# Patient Record
Sex: Female | Born: 2000 | Race: White | Hispanic: No | Smoking: Current every day smoker
Health system: Southern US, Community
[De-identification: ages and names within clinical notes are randomized; demographics above are authoritative.]

## PROBLEM LIST (undated history)

## (undated) DIAGNOSIS — K3184 Gastroparesis: Secondary | ICD-10-CM

## (undated) DIAGNOSIS — T8859XA Other complications of anesthesia, initial encounter: Secondary | ICD-10-CM

## (undated) DIAGNOSIS — F909 Attention-deficit hyperactivity disorder, unspecified type: Secondary | ICD-10-CM

## (undated) DIAGNOSIS — G90A Postural orthostatic tachycardia syndrome (POTS): Secondary | ICD-10-CM

## (undated) DIAGNOSIS — F329 Major depressive disorder, single episode, unspecified: Secondary | ICD-10-CM

## (undated) DIAGNOSIS — F419 Anxiety disorder, unspecified: Secondary | ICD-10-CM

## (undated) DIAGNOSIS — F609 Personality disorder, unspecified: Secondary | ICD-10-CM

## (undated) DIAGNOSIS — R569 Unspecified convulsions: Secondary | ICD-10-CM

## (undated) DIAGNOSIS — R000 Tachycardia, unspecified: Secondary | ICD-10-CM

## (undated) DIAGNOSIS — Z8489 Family history of other specified conditions: Secondary | ICD-10-CM

## (undated) DIAGNOSIS — N323 Diverticulum of bladder: Secondary | ICD-10-CM

## (undated) DIAGNOSIS — J189 Pneumonia, unspecified organism: Secondary | ICD-10-CM

## (undated) DIAGNOSIS — K219 Gastro-esophageal reflux disease without esophagitis: Secondary | ICD-10-CM

## (undated) DIAGNOSIS — D649 Anemia, unspecified: Secondary | ICD-10-CM

## (undated) HISTORY — PX: APPENDECTOMY: SHX54

## (undated) HISTORY — PX: HIP ARTHROPLASTY: SHX981

## (undated) HISTORY — PX: OTHER SURGICAL HISTORY: SHX169

---
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
## 2022-05-23 ENCOUNTER — Other Ambulatory Visit: Payer: Self-pay | Admitting: Neurosurgery

## 2022-06-06 ENCOUNTER — Other Ambulatory Visit: Payer: Self-pay | Admitting: Neurosurgery

## 2022-06-09 ENCOUNTER — Other Ambulatory Visit: Payer: Self-pay

## 2022-06-09 ENCOUNTER — Encounter (HOSPITAL_COMMUNITY): Payer: Self-pay | Admitting: Neurosurgery

## 2022-06-09 NOTE — Progress Notes (Signed)
Anesthesia Chart Review: SAME DAY WORK-UP  Case: [id number] Date/Time: 06/10/22 0923   Procedure: VAGAL NERVE STIMULATOR IMPLANT - 3C   Anesthesia type: General   Pre-op diagnosis: PARTIAL SYMPTOMATIC EPILEPSY WITH COMPLEX PARTIAL SEIZURES,INTRACTABLE, WITHOUT STATUS EPILEPTICUS   Location: [facility] OR ROOM 18 / [facility] OR   Surgeons: Lisbeth Renshaw, MD       DISCUSSION: Patient is a 21 year old female scheduled for the above procedure.  History obtained by PAT RN interview and [facility] chart review. History includes never smoker, POTS, post-COVID syndrome, epilepsy, ADHD, anxiety. GERD, anorexia nervosa, appendectomy, left hip arthroscopy (07/04/18).  Reported that she "freaks out" with anesthesia.  - GI consult 05/19/22 by Duane Boston, PA-C. Patient with "a long history of waxing and waning anorexia nervosa presenting with worsening nausea, vomiting of undigested food after several days, and postprandial abdominal pain. She has previously been treated by her PCP with Prevacid, Megace, Marinol, and Creon. Prior evaluation when she was 15, 6 years ago with EGD and colonoscopy were normal. Colonoscopy more recently in 2019 was normal again." Gastroparesis suspected, but plan to start with EGD. Changed to Nexium.  - Last cardiology visit 04/21/22 with Shaune Pascal, NP for inappropriate sinus tachycardia, POTS, post COVID syndrome.  No definitive arrhythmias on January 2023 Holter monitor. Patient had a consultation with electrophysiology Chales Abrahams, Archie, MD) as treatment with beta-blocker caused symptomatic hypotension. A tilt table test was negative.  She stopped taking midodrine because it was causing high BP readings, and she did not believe it was helping.  She had not worn compression stockings nor elevated her extremities.  She did try to increase her hydration.  Reported plan for evaluation for Ehlers-Danlos syndrome..  Continue conservative measures for POTS.  She was provided information  regarding [facility] POTS clinic.  3 to 31-month follow-up planned. Most recent cardiac testing outlined below in CV section.   She is a same-day work-up, so anesthesia team to evaluate on the day of surgery.   PROVIDERS: Wall, Nadara Mustard, FNP is PCP  Isabelle Course, PA-C is rheumatology provider Lenis Noon, DO is cardiologist   LABS: For day of surgery as indicated.   IMAGES: MRI Head 02/23/21 ([facility]): IMPRESSION:  1.  No acute intracranial abnormality.  2.  Small ventricles, which can be normal for age. No other evidence to suggest IIH.  3.  Chronic bilateral maxillary sinus disease. Possible prior intranasal sinus surgery, correlate with history.     EKG: EKG 10/17/21 ([facility]): Per result narrative in Care Everywhere: "Diagnosis Normal sinus rhythm  Possible Left atrial enlargement  T wave abnormality, consider inferior ischemia  Abnormal ECG  When compared with ECG of 16-Oct-2021 21:47,  No significant change was found  Wilmot, Kobina ([id number]) on 10/19/2021 9:37:25 PM certifies that he/she has reviewed the ECG tracing and confirms the  independent interpretation is  correct."   CV: Holter Monitor 12/03/21 ([facility]): Impression: Normal ambulatory ECG monitor with predominant sinus rhythm.  Sinus tachycardia was noted 26% of the time with no malignant tachycardic rhythms noted. Symptoms were reported but not correlated with any malignant arrhythmias or significant ectopy.  Clinical correlation is recommended.   Echo 11/05/21 ([facility]): Normal left ventricular chamber size, wall thickness, and systolic wall  motion.    Preserved LVEF 50-55%.  Manual apical biplane EF 50-55%. Impaired GLS  -16.1%.    Grade 1 diastolic dysfunction.    No significant valvular abnormalities are noted.    Pulmonary artery systolic pressure cannot be determined.  Compared to prior echocardiogram report dated 12/29/2020: There are no  significant changes noted.   Tilt Table Test 02/11/21  ([facility]): This result has an attachment that is not available.  Normal baseline BP and heart rate  Essentially normal BP and heart rate response during HUTT maneuver  Patient reported "dizziness and blurred vision " despite unchanged BP and  heart rate response  No evidence of abnormal neurocardiogenic response during HUTT maneuver   30 Day Event Monitor 12/29/20-01/27/21 ([facility]): Impression: Normal ambulatory ECG monitor with predominant sinus rhythm. No symptoms were reported. Symptoms were reported but not correlated with any malignant arrhythmias or significant ectopy.  There was a reported sustained episode of SVT but upon further review, appears to be sinus tachycardia as it is still within the normal physiologic range expected for her age.   Past Medical History:  Diagnosis Date   ADHD (attention deficit hyperactivity disorder)    Anemia    Anxiety    Complication of anesthesia    freaks out   Family history of adverse reaction to anesthesia    PGF - n/v   GERD (gastroesophageal reflux disease)    Pneumonia    "WALKING" x 2   Seizures (HCC)     Past Surgical History:  Procedure Laterality Date   APPENDECTOMY     cyst from O[place]     HIP ARTHROPLASTY Left     MEDICATIONS: No current facility-administered medications for this encounter.    albuterol (VENTOLIN HFA) 108 (90 Base) MCG/ACT inhaler   clonazePAM (KLONOPIN) 1 MG tablet   EPIPEN 2-PAK 0.3 MG/0.3ML SOAJ injection   esomeprazole (NEXIUM) 40 MG capsule   hydrocortisone cream 1 %   ondansetron (ZOFRAN) 8 MG tablet   promethazine (PHENERGAN) 25 MG tablet   VYVANSE 60 MG capsule    Shonna Chock, PA-C Surgical Short Stay/Anesthesiology [facility] Phone [phone] [facility] Phone [phone] 06/09/2022 5:06 PM

## 2022-06-09 NOTE — Progress Notes (Addendum)
Rebecca Hale denies chest pain or shortness of breath. Patient denies having any s/s of Covid in her household.  Patient denies any known exposure to Covid.  Rebecca Hale has a Psychiatrist cardiologist and PCP, she reported to me that she is trying to change Drs.  Rebecca Hale states that she has a guardian that will need to allowed to be with her is she "freaks out"  when she wakes up. Patient reports that she will bring papers.  Rebecca Hale reports that she is drinking a lot of fluids to stay hydrated.  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

## 2022-06-10 ENCOUNTER — Encounter (HOSPITAL_COMMUNITY)
Admission: RE | Disposition: A | Discharge status: Home / Self Care | Payer: Self-pay | Source: Home / Self Care | Attending: Neurosurgery

## 2022-06-10 ENCOUNTER — Ambulatory Visit (HOSPITAL_BASED_OUTPATIENT_CLINIC_OR_DEPARTMENT_OTHER): Payer: Medicaid Other | Admitting: Vascular Surgery

## 2022-06-10 ENCOUNTER — Other Ambulatory Visit: Payer: Self-pay

## 2022-06-10 ENCOUNTER — Ambulatory Visit (HOSPITAL_COMMUNITY): Payer: Medicaid Other | Admitting: Vascular Surgery

## 2022-06-10 ENCOUNTER — Encounter (HOSPITAL_COMMUNITY): Payer: Self-pay | Admitting: Neurosurgery

## 2022-06-10 ENCOUNTER — Ambulatory Visit (HOSPITAL_COMMUNITY)
Admission: RE | Admit: 2022-06-10 | Discharge: 2022-06-10 | Disposition: A | Discharge status: Home / Self Care | Payer: Medicaid Other | Attending: Neurosurgery | Admitting: Neurosurgery

## 2022-06-10 DIAGNOSIS — G90A Postural orthostatic tachycardia syndrome (POTS): Secondary | ICD-10-CM | POA: Diagnosis not present

## 2022-06-10 DIAGNOSIS — G40919 Epilepsy, unspecified, intractable, without status epilepticus: Secondary | ICD-10-CM | POA: Insufficient documentation

## 2022-06-10 DIAGNOSIS — F909 Attention-deficit hyperactivity disorder, unspecified type: Secondary | ICD-10-CM

## 2022-06-10 DIAGNOSIS — F418 Other specified anxiety disorders: Secondary | ICD-10-CM | POA: Diagnosis not present

## 2022-06-10 HISTORY — DX: Other complications of anesthesia, initial encounter: T88.59XA

## 2022-06-10 HISTORY — DX: Major depressive disorder, single episode, unspecified: F32.9

## 2022-06-10 HISTORY — DX: Diverticulum of bladder: N32.3

## 2022-06-10 HISTORY — DX: Personality disorder, unspecified: F60.9

## 2022-06-10 HISTORY — DX: Anxiety disorder, unspecified: F41.9

## 2022-06-10 HISTORY — DX: Tachycardia, unspecified: R00.0

## 2022-06-10 HISTORY — DX: Gastroparesis: K31.84

## 2022-06-10 HISTORY — DX: Pneumonia, unspecified organism: J18.9

## 2022-06-10 HISTORY — PX: VAGUS NERVE STIMULATOR INSERTION: SHX348

## 2022-06-10 HISTORY — DX: Gastro-esophageal reflux disease without esophagitis: K21.9

## 2022-06-10 HISTORY — DX: Attention-deficit hyperactivity disorder, unspecified type: F90.9

## 2022-06-10 HISTORY — DX: Family history of other specified conditions: Z84.89

## 2022-06-10 HISTORY — DX: Postural orthostatic tachycardia syndrome (POTS): G90.A

## 2022-06-10 HISTORY — DX: Unspecified convulsions: R56.9

## 2022-06-10 HISTORY — DX: Anemia, unspecified: D64.9

## 2022-06-10 LAB — CBC
HCT: 44 % (ref 36.0–46.0)
Hemoglobin: 15 g/dL (ref 12.0–15.0)
MCH: 30.7 pg (ref 26.0–34.0)
MCHC: 34.1 g/dL (ref 30.0–36.0)
MCV: 90.2 fL (ref 80.0–100.0)
Platelets: 291 10*3/uL (ref 150–400)
RBC: 4.88 MIL/uL (ref 3.87–5.11)
RDW: 13.5 % (ref 11.5–15.5)
WBC: 6.2 10*3/uL (ref 4.0–10.5)
nRBC: 0 % (ref 0.0–0.2)

## 2022-06-10 LAB — POCT PREGNANCY, URINE: Preg Test, Ur: NEGATIVE

## 2022-06-10 LAB — SURGICAL PCR SCREEN
MRSA, PCR: NEGATIVE
Staphylococcus aureus: NEGATIVE

## 2022-06-10 SURGERY — VAGAL NERVE STIMULATOR IMPLANT
Anesthesia: General

## 2022-06-10 MED ORDER — EPHEDRINE SULFATE-NACL 50-0.9 MG/10ML-% IV SOSY
PREFILLED_SYRINGE | INTRAVENOUS | Status: DC | PRN
  Administered 2022-06-10: 10 mg via INTRAVENOUS

## 2022-06-10 MED ORDER — ORAL CARE MOUTH RINSE
15.0000 mL | Freq: Once | OROMUCOSAL | Status: AC
Start: 2022-06-10 — End: 2022-06-10

## 2022-06-10 MED ORDER — BUPIVACAINE HCL (PF) 0.5 % IJ SOLN
INTRAMUSCULAR | Status: AC
  Filled 2022-06-10: qty 30

## 2022-06-10 MED ORDER — ONDANSETRON HCL 4 MG/2ML IJ SOLN: 4.0000 mg | Freq: Once | INTRAMUSCULAR | Status: DC | PRN

## 2022-06-10 MED ORDER — CHLORHEXIDINE GLUCONATE CLOTH 2 % EX PADS: 6.0000 | MEDICATED_PAD | Freq: Once | CUTANEOUS | Status: DC

## 2022-06-10 MED ORDER — FENTANYL CITRATE (PF) 100 MCG/2ML IJ SOLN: 25.0000 ug | INTRAMUSCULAR | Status: DC | PRN

## 2022-06-10 MED ORDER — ROCURONIUM BROMIDE 10 MG/ML (PF) SYRINGE
PREFILLED_SYRINGE | INTRAVENOUS | Status: AC
  Filled 2022-06-10: qty 10

## 2022-06-10 MED ORDER — FENTANYL CITRATE (PF) 250 MCG/5ML IJ SOLN
INTRAMUSCULAR | Status: DC | PRN
  Administered 2022-06-10 (×2): 50 ug via INTRAVENOUS

## 2022-06-10 MED ORDER — FENTANYL CITRATE (PF) 250 MCG/5ML IJ SOLN
INTRAMUSCULAR | Status: AC
  Filled 2022-06-10: qty 5

## 2022-06-10 MED ORDER — OXYCODONE-ACETAMINOPHEN 10-325 MG PO TABS: 1.0000 | ORAL_TABLET | Freq: Three times a day (TID) | ORAL | 0 refills | Status: AC | PRN

## 2022-06-10 MED ORDER — ROCURONIUM BROMIDE 10 MG/ML (PF) SYRINGE
PREFILLED_SYRINGE | INTRAVENOUS | Status: DC | PRN
  Administered 2022-06-10: 50 mg via INTRAVENOUS

## 2022-06-10 MED ORDER — SUGAMMADEX SODIUM 200 MG/2ML IV SOLN
INTRAVENOUS | Status: DC | PRN
  Administered 2022-06-10: 200 mg via INTRAVENOUS

## 2022-06-10 MED ORDER — LIDOCAINE 2% (20 MG/ML) 5 ML SYRINGE
INTRAMUSCULAR | Status: AC
  Filled 2022-06-10: qty 5

## 2022-06-10 MED ORDER — MIDAZOLAM HCL 2 MG/2ML IJ SOLN
INTRAMUSCULAR | Status: DC | PRN
  Administered 2022-06-10: 2 mg via INTRAVENOUS

## 2022-06-10 MED ORDER — MIDAZOLAM HCL 2 MG/2ML IJ SOLN
INTRAMUSCULAR | Status: AC
  Filled 2022-06-10: qty 2

## 2022-06-10 MED ORDER — LIDOCAINE-EPINEPHRINE 1 %-1:100000 IJ SOLN
INTRAMUSCULAR | Status: DC | PRN
  Administered 2022-06-10: 5 mL
  Administered 2022-06-10: 4 mL

## 2022-06-10 MED ORDER — 0.9 % SODIUM CHLORIDE (POUR BTL) OPTIME
TOPICAL | Status: DC | PRN
  Administered 2022-06-10: 1000 mL

## 2022-06-10 MED ORDER — CHLORHEXIDINE GLUCONATE 0.12 % MT SOLN: 15.0000 mL | Freq: Once | OROMUCOSAL | Status: AC

## 2022-06-10 MED ORDER — PROPOFOL 10 MG/ML IV BOLUS
INTRAVENOUS | Status: AC
  Filled 2022-06-10: qty 20

## 2022-06-10 MED ORDER — LACTATED RINGERS IV SOLN: INTRAVENOUS | Status: DC

## 2022-06-10 MED ORDER — GLYCOPYRROLATE 0.2 MG/ML IJ SOLN
INTRAMUSCULAR | Status: DC | PRN
  Administered 2022-06-10: .2 mg via INTRAVENOUS

## 2022-06-10 MED ORDER — LIDOCAINE-EPINEPHRINE 1 %-1:100000 IJ SOLN
INTRAMUSCULAR | Status: AC
  Filled 2022-06-10: qty 1

## 2022-06-10 MED ORDER — ACETAMINOPHEN 500 MG PO TABS
1000.0000 mg | ORAL_TABLET | Freq: Once | ORAL | Status: AC
  Administered 2022-06-10: 1000 mg via ORAL
  Filled 2022-06-10: qty 2

## 2022-06-10 MED ORDER — CEFAZOLIN SODIUM-DEXTROSE 2-4 GM/100ML-% IV SOLN
2.0000 g | INTRAVENOUS | Status: AC
  Administered 2022-06-10: 2 g via INTRAVENOUS
  Filled 2022-06-10: qty 100

## 2022-06-10 MED ORDER — DEXAMETHASONE SODIUM PHOSPHATE 10 MG/ML IJ SOLN
INTRAMUSCULAR | Status: DC | PRN
  Administered 2022-06-10: 10 mg via INTRAVENOUS

## 2022-06-10 MED ORDER — PHENYLEPHRINE 80 MCG/ML (10ML) SYRINGE FOR IV PUSH (FOR BLOOD PRESSURE SUPPORT)
PREFILLED_SYRINGE | INTRAVENOUS | Status: DC | PRN
  Administered 2022-06-10: 120 ug via INTRAVENOUS
  Administered 2022-06-10: 80 ug via INTRAVENOUS

## 2022-06-10 MED ORDER — PHENYLEPHRINE HCL-NACL 20-0.9 MG/250ML-% IV SOLN
INTRAVENOUS | Status: DC | PRN
  Administered 2022-06-10: 50 ug/min via INTRAVENOUS

## 2022-06-10 MED ORDER — THROMBIN 5000 UNITS EX SOLR
OROMUCOSAL | Status: DC | PRN
  Administered 2022-06-10: 5 mL via TOPICAL

## 2022-06-10 MED ORDER — LIDOCAINE 2% (20 MG/ML) 5 ML SYRINGE
INTRAMUSCULAR | Status: DC | PRN
  Administered 2022-06-10: 40 mg via INTRAVENOUS

## 2022-06-10 MED ORDER — BUPIVACAINE HCL 0.5 % IJ SOLN
INTRAMUSCULAR | Status: DC | PRN
  Administered 2022-06-10: 5 mL
  Administered 2022-06-10: 4 mL

## 2022-06-10 MED ORDER — CHLORHEXIDINE GLUCONATE 0.12 % MT SOLN
OROMUCOSAL | Status: AC
  Administered 2022-06-10: 15 mL via OROMUCOSAL
  Filled 2022-06-10: qty 15

## 2022-06-10 MED ORDER — DEXMEDETOMIDINE (PRECEDEX) IN NS 20 MCG/5ML (4 MCG/ML) IV SYRINGE
PREFILLED_SYRINGE | INTRAVENOUS | Status: DC | PRN
  Administered 2022-06-10 (×5): 4 ug via INTRAVENOUS

## 2022-06-10 MED ORDER — PROPOFOL 10 MG/ML IV BOLUS
INTRAVENOUS | Status: DC | PRN
  Administered 2022-06-10: 120 mg via INTRAVENOUS

## 2022-06-10 MED ORDER — ONDANSETRON HCL 4 MG/2ML IJ SOLN
INTRAMUSCULAR | Status: DC | PRN
  Administered 2022-06-10: 4 mg via INTRAVENOUS

## 2022-06-10 MED ORDER — THROMBIN 5000 UNITS EX SOLR
CUTANEOUS | Status: AC
  Filled 2022-06-10: qty 5000

## 2022-06-10 SURGICAL SUPPLY — 60 items
BAG COUNTER SPONGE SURGICOUNT (BAG) ×2 IMPLANT
BAND RUBBER #18 3X1/16 STRL (MISCELLANEOUS) ×2 IMPLANT
BENZOIN TINCTURE PRP APPL 2/3 (GAUZE/BANDAGES/DRESSINGS) IMPLANT
BLADE CLIPPER SURG (BLADE) IMPLANT
BLADE SURG 11 STRL SS (BLADE) ×3 IMPLANT
CANISTER SUCT 3000ML PPV (MISCELLANEOUS) ×2 IMPLANT
CARTRIDGE OIL MAESTRO DRILL (MISCELLANEOUS) IMPLANT
DERMABOND ADVANCED (GAUZE/BANDAGES/DRESSINGS) ×1
DERMABOND ADVANCED .7 DNX12 (GAUZE/BANDAGES/DRESSINGS) ×1 IMPLANT
DIFFUSER DRILL AIR PNEUMATIC (MISCELLANEOUS) IMPLANT
DRAPE CAMERA VIDEO/LASER (DRAPES) ×2 IMPLANT
DRAPE HALF SHEET 40X57 (DRAPES) IMPLANT
DRAPE LAPAROTOMY 100X72 PEDS (DRAPES) ×2 IMPLANT
DRAPE MICROSCOPE LEICA (MISCELLANEOUS) ×1 IMPLANT
DRSG OPSITE POSTOP 3X4 (GAUZE/BANDAGES/DRESSINGS) IMPLANT
DRSG TEGADERM 4X4.75 (GAUZE/BANDAGES/DRESSINGS) ×8 IMPLANT
DURAPREP 6ML APPLICATOR 50/CS (WOUND CARE) ×2 IMPLANT
ELECT COATED BLADE 2.86 ST (ELECTRODE) ×2 IMPLANT
ELECT REM PT RETURN 9FT ADLT (ELECTROSURGICAL) ×2
ELECTRODE REM PT RTRN 9FT ADLT (ELECTROSURGICAL) ×1 IMPLANT
GAUZE 4X4 16PLY ~~LOC~~+RFID DBL (SPONGE) IMPLANT
GENERATOR MODEL 106 ASPIRE (Neuro Prosthesis/Implant) ×1 IMPLANT
GLOVE BIO SURGEON STRL SZ7.5 (GLOVE) ×1 IMPLANT
GLOVE BIOGEL PI IND STRL 7.0 (GLOVE) IMPLANT
GLOVE BIOGEL PI IND STRL 7.5 (GLOVE) ×2 IMPLANT
GLOVE BIOGEL PI INDICATOR 7.0 (GLOVE) ×1
GLOVE BIOGEL PI INDICATOR 7.5 (GLOVE) ×2
GLOVE ECLIPSE 7.0 STRL STRAW (GLOVE) ×1 IMPLANT
GLOVE SURG SS PI 6.5 STRL IVOR (GLOVE) ×3 IMPLANT
GOWN STRL REUS W/ TWL LRG LVL3 (GOWN DISPOSABLE) ×2 IMPLANT
GOWN STRL REUS W/ TWL XL LVL3 (GOWN DISPOSABLE) IMPLANT
GOWN STRL REUS W/TWL 2XL LVL3 (GOWN DISPOSABLE) IMPLANT
GOWN STRL REUS W/TWL LRG LVL3 (GOWN DISPOSABLE) ×3
GOWN STRL REUS W/TWL XL LVL3 (GOWN DISPOSABLE)
HEMOSTAT POWDER KIT SURGIFOAM (HEMOSTASIS) ×2 IMPLANT
KIT BASIN OR (CUSTOM PROCEDURE TRAY) ×2 IMPLANT
KIT TURNOVER KIT B (KITS) ×2 IMPLANT
LEAD PERENNIAFLEX 2-3 304 (Neuro Prosthesis/Implant) ×1 IMPLANT
LOOP VESSEL MAXI BLUE (MISCELLANEOUS) IMPLANT
LOOP VESSEL MINI RED (MISCELLANEOUS) IMPLANT
NEEDLE HYPO 22GX1.5 SAFETY (NEEDLE) ×2 IMPLANT
NS IRRIG 1000ML POUR BTL (IV SOLUTION) ×2 IMPLANT
OIL CARTRIDGE MAESTRO DRILL (MISCELLANEOUS)
PACK LAMINECTOMY NEURO (CUSTOM PROCEDURE TRAY) ×2 IMPLANT
PAD ARMBOARD 7.5X6 YLW CONV (MISCELLANEOUS) ×6 IMPLANT
SPIKE FLUID TRANSFER (MISCELLANEOUS) ×2 IMPLANT
SPONGE INTESTINAL PEANUT (DISPOSABLE) IMPLANT
SPONGE SURGIFOAM ABS GEL SZ50 (HEMOSTASIS) IMPLANT
SUT ETHILON 3 0 FSL (SUTURE) IMPLANT
SUT NURALON 4 0 TR CR/8 (SUTURE) ×2 IMPLANT
SUT VIC AB 0 CT1 27 (SUTURE)
SUT VIC AB 0 CT1 27XBRD ANBCTR (SUTURE) ×1 IMPLANT
SUT VIC AB 0 CT2 27 (SUTURE) ×1 IMPLANT
SUT VIC AB 3-0 SH 18 (SUTURE) IMPLANT
SUT VIC AB 3-0 SH 8-18 (SUTURE) ×3 IMPLANT
SUT VICRYL 3-0 RB1 18 ABS (SUTURE) ×5 IMPLANT
TOWEL GREEN STERILE (TOWEL DISPOSABLE) ×2 IMPLANT
TOWEL GREEN STERILE FF (TOWEL DISPOSABLE) ×2 IMPLANT
TUNNELING TOOL (MISCELLANEOUS) ×1 IMPLANT
WATER STERILE IRR 1000ML POUR (IV SOLUTION) ×2 IMPLANT

## 2022-06-10 NOTE — Anesthesia Preprocedure Evaluation (Addendum)
Anesthesia Evaluation  Patient identified by MRN, date of birth, ID band Patient awake    Reviewed: Allergy & Precautions, NPO status , Patient's Chart, lab work & pertinent test results  History of Anesthesia Complications (+) Family history of anesthesia reaction and history of anesthetic complications  Airway Mallampati: II  TM Distance: >3 FB Neck ROM: Full    Dental  (+) Teeth Intact, Dental Advisory Given   Pulmonary Current Smoker and Patient abstained from smoking.,    Pulmonary exam normal breath sounds clear to auscultation       Cardiovascular Normal cardiovascular exam Rhythm:Regular Rate:Normal  POTS    Neuro/Psych Seizures -, Poorly Controlled,  PSYCHIATRIC DISORDERS Anxiety Depression PARTIAL SYMPTOMATIC EPILEPSY WITH COMPLEX PARTIAL SEIZURES,INTRACTABLE, WITHOUT STATUS EPILEPTICUS    GI/Hepatic Neg liver ROS, GERD  ,Gastroparesis    Endo/Other  negative endocrine ROS  Renal/GU negative Renal ROS     Musculoskeletal negative musculoskeletal ROS (+)   Abdominal   Peds  (+) ADHD Hematology negative hematology ROS (+)   Anesthesia Other Findings Day of surgery medications reviewed with the patient.  Reproductive/Obstetrics negative OB ROS                            Anesthesia Physical Anesthesia Plan  ASA: 3  Anesthesia Plan: General   Post-op Pain Management: Tylenol PO (pre-op)*   Induction: Intravenous  PONV Risk Score and Plan: 2 and Midazolam, Dexamethasone and Ondansetron  Airway Management Planned: Oral ETT  Additional Equipment:   Intra-op Plan:   Post-operative Plan: Extubation in OR  Informed Consent: I have reviewed the patients History and Physical, chart, labs and discussed the procedure including the risks, benefits and alternatives for the proposed anesthesia with the patient or authorized representative who has indicated his/her understanding and  acceptance.     Dental advisory given  Plan Discussed with: CRNA  Anesthesia Plan Comments:         Anesthesia Quick Evaluation

## 2022-06-10 NOTE — Op Note (Signed)
PREOP DIAGNOSIS: Medically intractable epilepsy   POSTOP DIAGNOSIS: Same  PROCEDURE: 1. Placement of left Vagal nerve stimulator  SURGEON: Dr. Lisbeth Renshaw, MD  ASSISTANT: None  ANESTHESIA: General Endotracheal  EBL: Minimal  SPECIMENS: None  DRAINS: None  COMPLICATIONS: None immediate  CONDITION: Hemodynamically stable to PACU  HISTORY: Aryan Bello is a 21 y.o. female who was initially seen in the outpatient clinic as a referral from her neurologist for medically intractable epilepsy.  Surgical treatment options for her medically intractable epilepsy were extensively reviewed.  The patient did want to proceed with placement of a vagal nerve stimulator.  The patient appeared to be a good candidate for the procedure. The risks and benefits of the surgery were reviewed in detail. After all questions were answered, informed consent was obtained.  PROCEDURE IN DETAIL: After informed consent was obtained and witnessed, the patient was brought to the operating room. After induction of general anesthesia, the patient was positioned on the operative table in the supine position. All pressure points were meticulously padded. Skin incisions were then marked out and prepped and draped in the usual sterile fashion.  After timeout was conducted, left sided transverse neck skin incision was infiltrated with local anesthetic with epinephrine. Skin incision was then made sharply, and Bovie electrocautery was used to dissect the subcutaneous tissue. The platysma muscle was identified and incised. The platysma was then undermined. The medial border of the sternocleidomastoid muscle was identified, and dissection was carried out utilizing natural tissue planes of the neck until the carotid sheath was identified. The jugular vein was initially identified and the carotid sheath was incised. The vagus nerve was then identified running between the carotid and jugular. The vagus nerve was  circumferentially dissected for length of a proximally 2-1/2 cm. The infraclavicular incision was then made, and Bovie electrocautery was used to dissect the subcutaneous tissue. A subcutaneous pocket was then made. A subcutaneous tunneler was then passed from the neck to the infraclavicular incision. The vagal stimulator leads were then tunneled. The leads were then placed on the vagus nerve. A relaxing curl was then placed, and the leaves were tacked to the sternocleidomastoid muscle. The generator was then connected to the leads, and testing was done to confirm normal impedance, and good placement and heart rate detection. The generator was then placed in the subcutaneous pocket. Wounds were then irrigated with copious amounts of normal saline irrigation. The platysma was closed using interrupted 3-0 Vicryl stitches. Subcutaneous layer and the subclavicular incision was closed using interrupted 3-0 Vicryl stitches. Skin was then closed using interrupted 3-0 Vicryl, and a layer of Dermabond was applied.   At the end of the case all sponge, needle, cottonoid, and instrument counts were correct. The patient was then extubated, transferred to the stretcher, and taken to the postanesthesia care unit in stable hemodynamic condition.   Lisbeth Renshaw, MD [facility]

## 2022-06-10 NOTE — Discharge Summary (Signed)
  Physician Discharge Summary  Patient ID: Rebecca Hale MRN: [id number] DOB/AGE: 22-16-2002 22 y.o.  Admit date: 06/10/2022 Discharge date: 06/10/2022  Admission Diagnoses:  Medically intractable epilepsy  Discharge Diagnoses:  Same Active Problems:   * No active hospital problems. *   Discharged Condition: Stable  Hospital Course:  Rebecca Hale is a 21 y.o. female who underwent uncomplicated placement of a left VNS. She was at baseline postop and discharged home from PACU in stable condition.  Treatments: Surgery - Placement of left VNS  Discharge Exam: Blood pressure (!) 96/51, pulse (!) 57, temperature (!) 97.5 F (36.4 C), resp. rate 14, height 5\' 6"  (1.676 m), weight 42.2 kg, last menstrual period 05/30/2022, SpO2 99 %. Awake, alert, oriented Speech fluent, appropriate CN grossly intact 5/5 BUE/BLE Wound c/d/i  Disposition:    Allergies as of 06/10/2022       Reactions   Amoxicillin Diarrhea, Nausea And Vomiting, Nausea Only   Other reaction(s): GI Upset (intolerance), Other   Bupropion Nausea And Vomiting   Iodine Rash   Nickel Rash   Oseltamivir    Other reaction(s): Delusions (intolerance), Mental Status Changes (intolerance), Other, Psychosis (intolerance)   Shellfish Allergy Rash   Sulfa Antibiotics Nausea And Vomiting   Novocain [procaine] Other (See Comments)   All cain ineffective   Penicillins Other (See Comments)   projectile vomit   Stadol [butorphanol] Other (See Comments)   temors   Sulfamethoxazole-trimethoprim Nausea And Vomiting   Latex Rash   Tramadol Rash        Medication List     TAKE these medications    albuterol 108 (90 Base) MCG/ACT inhaler Commonly known as: VENTOLIN HFA Inhale 2 puffs into the lungs every 6 (six) hours as needed for shortness of breath.   clonazePAM 1 MG tablet Commonly known as: KLONOPIN Take 1 mg by mouth See admin instructions. Take 1 mg twice a day in the morning and bedtime and an additional 1  mg if needed during the day for seizures   EpiPen 2-Pak 0.3 mg/0.3 mL Soaj injection Generic drug: EPINEPHrine Inject 0.3 mg into the muscle as needed for anaphylaxis.   esomeprazole 40 MG capsule Commonly known as: NEXIUM Take 40 mg by mouth every morning.   hydrocortisone cream 1 % Apply 1 Application topically daily as needed for itching.   ondansetron 8 MG tablet Commonly known as: ZOFRAN Take 8 mg by mouth every 8 (eight) hours as needed for nausea or vomiting.   oxyCODONE-acetaminophen 10-325 MG tablet Commonly known as: Percocet Take 1 tablet by mouth every 8 (eight) hours as needed for up to 3 days for pain.   promethazine 25 MG tablet Commonly known as: PHENERGAN Take 25 mg by mouth every 6 (six) hours as needed for nausea or vomiting.   Vyvanse 60 MG capsule Generic drug: lisdexamfetamine Take 60 mg by mouth daily.        Follow-up Information     06/12/2022, MD Follow up.   Specialty: Neurosurgery Contact information: [address] [place] [phone]                 Signed: [phone] 06/10/2022, 3:23 PM

## 2022-06-10 NOTE — H&P (Signed)
Chief Complaint   Epilepsy  History of Present Illness  Rebecca Hale is a 21 y.o. female with a history of medically intractable epilepsy for several years.  She has undergone multiple EEG tests and been diagnosed with complex partial epilepsy.  She has been attempted on multiple different antiepileptic medications with significant side effects.  She is currently maintained on Trileptal.  After discussion of surgical options, she elected to proceed with vagal nerve stimulator therapy.  Past Medical History   Past Medical History:  Diagnosis Date   ADHD (attention deficit hyperactivity disorder)    Anemia    Anxiety    Complication of anesthesia    freaks out   Diverticula, bladder    Family history of adverse reaction to anesthesia    PGF - n/v   Gastroparesis    GERD (gastroesophageal reflux disease)    Major depressive disorder    Personality disorder (HCC)    Pneumonia    "WALKING" x 2   POTS (postural orthostatic tachycardia syndrome)    Seizures (HCC)    Sinus tachycardia     Past Surgical History   Past Surgical History:  Procedure Laterality Date   APPENDECTOMY     cyst from [place]     HIP ARTHROPLASTY Left     Social History   Social History   Tobacco Use   Smoking status: Every Day  Vaping Use   Vaping Use: Some days   Substances: Nicotine  Substance Use Topics   Alcohol use: Not Currently    Comment: rare   Drug use: Yes    Frequency: 5.0 times per week    Types: Marijuana    Comment: last used- 06/07/22    Medications   Prior to Admission medications   Medication Sig Start Date End Date Taking? Authorizing Provider  albuterol (VENTOLIN HFA) 108 (90 Base) MCG/ACT inhaler Inhale 2 puffs into the lungs every 6 (six) hours as needed for shortness of breath.   Yes [provider]  clonazePAM (KLONOPIN) 1 MG tablet Take 1 mg by mouth See admin instructions. Take 1 mg twice a day in the morning and bedtime and an additional 1 mg if needed  during the day for seizures 05/13/22  Yes [provider]  EPIPEN 2-PAK 0.3 MG/0.3ML SOAJ injection Inject 0.3 mg into the muscle as needed for anaphylaxis. 02/14/22  Yes [provider]  esomeprazole (NEXIUM) 40 MG capsule Take 40 mg by mouth every morning.   Yes [provider]  hydrocortisone cream 1 % Apply 1 Application topically daily as needed for itching.   Yes [provider]  ondansetron (ZOFRAN) 8 MG tablet Take 8 mg by mouth every 8 (eight) hours as needed for nausea or vomiting.   Yes [provider]  promethazine (PHENERGAN) 25 MG tablet Take 25 mg by mouth every 6 (six) hours as needed for nausea or vomiting.   Yes [provider]  VYVANSE 60 MG capsule Take 60 mg by mouth daily. 05/12/22  Yes [provider]    Allergies   Allergies  Allergen Reactions   Amoxicillin Diarrhea, Nausea And Vomiting and Nausea Only    Other reaction(s): GI Upset (intolerance), Other   Bupropion Nausea And Vomiting   Iodine Rash   Nickel Rash   Oseltamivir     Other reaction(s): Delusions (intolerance), Mental Status Changes (intolerance), Other, Psychosis (intolerance)   Shellfish Allergy Rash   Sulfa Antibiotics Nausea And Vomiting   Novocain [Procaine] Other (See Comments)  All cain ineffective   Penicillins Other (See Comments)    projectile vomit   Stadol [Butorphanol] Other (See Comments)    temors   Sulfamethoxazole-Trimethoprim Nausea And Vomiting   Latex Rash   Tramadol Rash    Review of Systems  ROS  Neurologic Exam  Awake, alert, oriented Memory and concentration grossly intact Speech fluent, appropriate CN grossly intact Motor exam: Upper Extremities Deltoid Bicep Tricep Grip  Right 5/5 5/5 5/5 5/5  Left 5/5 5/5 5/5 5/5   Lower Extremities IP Quad PF DF EHL  Right 5/5 5/5 5/5 5/5 5/5  Left 5/5 5/5 5/5 5/5 5/5   Sensation grossly intact to LT   Impression  - 21 y.o. female with medically  intractable complex partial epilepsy.  She is a good candidate for vagal nerve stimulator therapy.  Plan  -We will plan on proceeding with placement of a left vagal nerve stimulator  I have reviewed the details of the operation as well as the expected postoperative course and recovery with the patient and her boyfriend in the office.  We have also discussed the associated risks, benefits, and alternatives to surgery.  All their questions today were answered and she provided informed consent to proceed.  Lisbeth Renshaw, MD [facility]

## 2022-06-10 NOTE — Anesthesia Procedure Notes (Signed)
Procedure Name: Intubation Date/Time: 06/10/2022 12:20 PM  Performed by: Rosiland Oz, CRNAPre-anesthesia Checklist: Patient identified, Emergency Drugs available, Suction available, Patient being monitored and Timeout performed Patient Re-evaluated:Patient Re-evaluated prior to induction Oxygen Delivery Method: Circle system utilized Preoxygenation: Pre-oxygenation with 100% oxygen Induction Type: IV induction Ventilation: Mask ventilation without difficulty Laryngoscope Size: Miller and 3 Grade View: Grade I Tube type: Oral Tube size: 7.0 mm Number of attempts: 1 Airway Equipment and Method: Stylet Placement Confirmation: ETT inserted through vocal cords under direct vision, positive ETCO2 and breath sounds checked- equal and bilateral Secured at: 21 cm Tube secured with: Tape Dental Injury: Teeth and Oropharynx as per pre-operative assessment

## 2022-06-10 NOTE — Transfer of Care (Signed)
Immediate Anesthesia Transfer of Care Note  Patient: Rebecca Hale  Procedure(s) Performed: VAGAL NERVE STIMULATOR IMPLANT  Patient Location: PACU  Anesthesia Type:General  Level of Consciousness: drowsy and patient cooperative  Airway & Oxygen Therapy: Patient Spontanous Breathing  Post-op Assessment: Report given to RN and Post -op Vital signs reviewed and stable  Post vital signs: Reviewed and stable  Last Vitals:  Vitals Value Taken Time  BP    Temp    Pulse 61 06/10/22 1435  Resp    SpO2 100 % 06/10/22 1435  Vitals shown include unvalidated device data.  Last Pain:  Vitals:   06/10/22 0741  TempSrc:   PainSc: 5          Complications: No notable events documented.

## 2022-06-11 NOTE — Anesthesia Postprocedure Evaluation (Signed)
Anesthesia Post Note  Patient: Rebecca Hale  Procedure(s) Performed: VAGAL NERVE STIMULATOR IMPLANT     Patient location during evaluation: PACU Anesthesia Type: General Level of consciousness: awake and alert Pain management: pain level controlled Vital Signs Assessment: post-procedure vital signs reviewed and stable Respiratory status: spontaneous breathing, nonlabored ventilation, respiratory function stable and patient connected to nasal cannula oxygen Cardiovascular status: blood pressure returned to baseline and stable Postop Assessment: no apparent nausea or vomiting Anesthetic complications: no   No notable events documented.  Last Vitals:  Vitals:   06/10/22 1500 06/10/22 1515  BP: (!) 89/53 (!) 96/51  Pulse: 61 (!) 57  Resp: 15 14  Temp:  (!) 36.4 C  SpO2: 100% 99%    Last Pain:  Vitals:   06/10/22 1515  TempSrc:   PainSc: 0-No pain                 Collene Schlichter

## 2022-06-13 ENCOUNTER — Encounter (HOSPITAL_COMMUNITY): Payer: Self-pay | Admitting: Neurosurgery

## 2022-09-10 LAB — GLUCOSE, POCT (MANUAL RESULT ENTRY): POC Glucose: 85 mg/dl (ref 70–99)
